# Patient Record
Sex: Female | Born: 1995 | Race: Black or African American | Hispanic: No | Marital: Single | Smoking: Current every day smoker
Health system: Southern US, Community
[De-identification: ages and names within clinical notes are randomized; demographics above are authoritative.]

## PROBLEM LIST (undated history)

## (undated) ENCOUNTER — Inpatient Hospital Stay (HOSPITAL_COMMUNITY): Payer: Self-pay

## (undated) DIAGNOSIS — Z789 Other specified health status: Secondary | ICD-10-CM

## (undated) HISTORY — PX: NO PAST SURGERIES: SHX2092

---
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
## 2013-02-19 ENCOUNTER — Inpatient Hospital Stay (HOSPITAL_COMMUNITY)
Admission: AD | Admit: 2013-02-19 | Discharge: 2013-02-19 | Disposition: A | Discharge status: Home / Self Care | Payer: Medicaid Other | Source: Ambulatory Visit | Attending: Obstetrics and Gynecology | Admitting: Obstetrics and Gynecology

## 2013-02-19 ENCOUNTER — Encounter (HOSPITAL_COMMUNITY): Payer: Self-pay | Admitting: *Deleted

## 2013-02-19 LAB — OB RESULTS CONSOLE GBS: GBS: NEGATIVE

## 2013-02-19 LAB — OB RESULTS CONSOLE HEPATITIS B SURFACE ANTIGEN: Hepatitis B Surface Ag: NEGATIVE

## 2013-02-19 LAB — OB RESULTS CONSOLE ABO/RH: RH Type: POSITIVE

## 2013-02-19 LAB — OB RESULTS CONSOLE RPR: RPR: NONREACTIVE

## 2013-02-19 NOTE — Progress Notes (Signed)
Dr Senaida Ores notified of pt's c/o constant sharp pain lower abdomen.  Notified of sve and reassuring fhr tracing.  Orders rec for discharge now or sve in 1 hour and discharge if no change.

## 2013-06-01 ENCOUNTER — Encounter (HOSPITAL_COMMUNITY): Payer: Self-pay | Admitting: *Deleted

## 2014-06-10 ENCOUNTER — Encounter (HOSPITAL_COMMUNITY): Payer: Self-pay | Admitting: *Deleted

## 2014-08-19 ENCOUNTER — Encounter (HOSPITAL_BASED_OUTPATIENT_CLINIC_OR_DEPARTMENT_OTHER): Payer: Self-pay | Admitting: *Deleted

## 2014-08-19 DIAGNOSIS — R197 Diarrhea, unspecified: Secondary | ICD-10-CM | POA: Insufficient documentation

## 2014-08-19 DIAGNOSIS — Z3202 Encounter for pregnancy test, result negative: Secondary | ICD-10-CM | POA: Insufficient documentation

## 2014-08-19 DIAGNOSIS — Z79899 Other long term (current) drug therapy: Secondary | ICD-10-CM | POA: Diagnosis not present

## 2014-08-19 DIAGNOSIS — Z720 Tobacco use: Secondary | ICD-10-CM | POA: Diagnosis not present

## 2014-08-19 DIAGNOSIS — R109 Unspecified abdominal pain: Secondary | ICD-10-CM | POA: Diagnosis present

## 2014-08-19 NOTE — ED Notes (Signed)
C/o abd pain, smelly burps, endorses bloated/distention. Onset 2d ago. Last BM 2 hrs ago, describes as watery. Diarrhea x2 today. (denies: nv fever or bleeding or back pain), no relief with gasx yesterday. Last "put food in her mouth" 30 minutes ago.

## 2014-08-20 ENCOUNTER — Emergency Department (HOSPITAL_BASED_OUTPATIENT_CLINIC_OR_DEPARTMENT_OTHER)
Admission: EM | Admit: 2014-08-20 | Discharge: 2014-08-20 | Disposition: A | Discharge status: Home / Self Care | Payer: Medicaid Other | Attending: Emergency Medicine | Admitting: Emergency Medicine

## 2014-08-20 ENCOUNTER — Emergency Department (HOSPITAL_BASED_OUTPATIENT_CLINIC_OR_DEPARTMENT_OTHER): Payer: Medicaid Other

## 2014-08-20 DIAGNOSIS — R197 Diarrhea, unspecified: Secondary | ICD-10-CM

## 2014-08-20 DIAGNOSIS — R52 Pain, unspecified: Secondary | ICD-10-CM

## 2014-08-20 LAB — CBC WITH DIFFERENTIAL/PLATELET
BASOS PCT: 0 % (ref 0–1)
Basophils Absolute: 0 10*3/uL (ref 0.0–0.1)
EOS PCT: 1 % (ref 0–5)
Eosinophils Absolute: 0.1 10*3/uL (ref 0.0–0.7)
HEMATOCRIT: 36.1 % (ref 36.0–46.0)
HEMOGLOBIN: 12 g/dL (ref 12.0–15.0)
LYMPHS PCT: 36 % (ref 12–46)
Lymphs Abs: 3.1 10*3/uL (ref 0.7–4.0)
MCH: 26.7 pg (ref 26.0–34.0)
MCHC: 33.2 g/dL (ref 30.0–36.0)
MCV: 80.4 fL (ref 78.0–100.0)
MONOS PCT: 8 % (ref 3–12)
Monocytes Absolute: 0.7 10*3/uL (ref 0.1–1.0)
NEUTROS ABS: 4.8 10*3/uL (ref 1.7–7.7)
Neutrophils Relative %: 55 % (ref 43–77)
Platelets: 221 10*3/uL (ref 150–400)
RBC: 4.49 MIL/uL (ref 3.87–5.11)
RDW: 13.8 % (ref 11.5–15.5)
WBC: 8.7 10*3/uL (ref 4.0–10.5)

## 2014-08-20 LAB — COMPREHENSIVE METABOLIC PANEL
ALBUMIN: 3.8 g/dL (ref 3.5–5.2)
ALK PHOS: 61 U/L (ref 39–117)
ALT: 13 U/L (ref 0–35)
ANION GAP: 5 (ref 5–15)
AST: 19 U/L (ref 0–37)
BUN: 11 mg/dL (ref 6–23)
CALCIUM: 8.5 mg/dL (ref 8.4–10.5)
CO2: 26 mmol/L (ref 19–32)
CREATININE: 0.68 mg/dL (ref 0.50–1.10)
Chloride: 107 mEq/L (ref 96–112)
GFR calc non Af Amer: >90 mL/min (ref >90)
GLUCOSE: 97 mg/dL (ref 70–99)
POTASSIUM: 3.3 mmol/L — AB (ref 3.5–5.1)
Sodium: 138 mmol/L (ref 135–145)
TOTAL PROTEIN: 6.7 g/dL (ref 6.0–8.3)
Total Bilirubin: 0.1 mg/dL — ABNORMAL LOW (ref 0.3–1.2)

## 2014-08-20 LAB — LIPASE, BLOOD: Lipase: 29 U/L (ref 11–59)

## 2014-08-20 LAB — PREGNANCY, URINE: Preg Test, Ur: NEGATIVE

## 2014-08-20 MED ORDER — PANTOPRAZOLE SODIUM 40 MG IV SOLR
40.0000 mg | Freq: Once | INTRAVENOUS | Status: AC
  Administered 2014-08-20: 40 mg via INTRAVENOUS
  Filled 2014-08-20: qty 40

## 2014-08-20 MED ORDER — FENTANYL CITRATE 0.05 MG/ML IJ SOLN
INTRAMUSCULAR | Status: AC
  Administered 2014-08-20: 50 ug via INTRAVENOUS
  Filled 2014-08-20: qty 2

## 2014-08-20 MED ORDER — FENTANYL CITRATE 0.05 MG/ML IJ SOLN
50.0000 ug | Freq: Once | INTRAMUSCULAR | Status: AC
  Administered 2014-08-20: 50 ug via INTRAVENOUS

## 2014-08-20 NOTE — ED Notes (Signed)
Up to b/r, steady gait 

## 2014-08-20 NOTE — ED Notes (Signed)
Dr. Molpus into room 

## 2014-08-20 NOTE — ED Provider Notes (Signed)
CSN: [id number]     Arrival date & time 08/19/14  2258 History   First MD Initiated Contact with Patient 08/20/14 0205     Chief Complaint  Patient presents with  . Abdominal Pain     (Consider location/radiation/quality/duration/timing/severity/associated sxs/prior Treatment) HPI  This is a 19 year old female with a two-day history of abdominal pain. She states the abdominal pain is diffuse, is described as a pressure, and she rates it an 8 out of 10. It is not worse with eating. It is somewhat worse with movement or palpation. It is relieved with belching. She states her burps smell bad. She has taken Gas-X without relief. She also feels like her abdomen is bloated or distended. She denies fever, chills, nausea, vomiting, constipation, vaginal bleeding or dysuria. She has had watery, non-bloody diarrhea.  Past Medical History  Diagnosis Date  . No pertinent past medical history    Past Surgical History  Procedure Laterality Date  . No past surgeries     Family History  Problem Relation Age of Onset  . Adopted: Yes   History  Substance Use Topics  . Smoking status: Current Every Day Smoker    Types: Cigarettes  . Smokeless tobacco: Never Used     Comment: with preg  . Alcohol Use: No   OB History    Gravida Para Term Preterm AB TAB SAB Ectopic Multiple Living   1              Review of Systems  All other systems reviewed and are negative.   Allergies  Review of patient's allergies indicates no known allergies.  Home Medications   Prior to Admission medications   Medication Sig Start Date End Date Taking? Authorizing Provider  Prenatal Vit-Fe Fumarate-FA (PRENATAL MULTIVITAMIN) TABS Take 1 tablet by mouth daily.    Historical Provider, MD   BP 114/73 mmHg  Pulse 72  Temp(Src) 98.3 F (36.8 C) (Oral)  Resp 18  Ht 5\' 4"  (1.626 m)  Wt 145 lb (65.772 kg)  BMI 24.88 kg/m2  SpO2 100%  LMP 08/11/2014   Physical Exam General: Well-developed, well-nourished  female in no acute distress; appearance consistent with age of record HENT: normocephalic; atraumatic Eyes: pupils equal, round and reactive to light; extraocular muscles intact Neck: supple Heart: regular rate and rhythm Lungs: clear to auscultation bilaterally Abdomen: soft; nondistended; right upper quadrant and epigastric tenderness; no masses or hepatosplenomegaly; bowel sounds present; gallbladder not visualized on bedside ultrasound Extremities: No deformity; full range of motion; pulses normal Neurologic: Awake, alert and oriented; motor function intact in all extremities and symmetric; no facial droop Skin: Warm and dry Psychiatric: Flat affect    ED Course  Procedures (including critical care time)   MDM  Nursing notes and vitals signs, including pulse oximetry, reviewed.  Summary of this visit's results, reviewed by myself:  Labs:  Results for orders placed or performed during the hospital encounter of 08/20/14 (from the past 24 hour(s))  Comprehensive metabolic panel     Status: Abnormal   Collection Time: 08/20/14  2:21 AM  Result Value Ref Range   Sodium 138 135 - 145 mmol/L   Potassium 3.3 (L) 3.5 - 5.1 mmol/L   Chloride 107 96 - 112 mEq/L   CO2 26 19 - 32 mmol/L   Glucose, Bld 97 70 - 99 mg/dL   BUN 11 6 - 23 mg/dL   Creatinine, Ser 4.090.68 0.50 - 1.10 mg/dL   Calcium 8.5 8.4 - 81.110.5 mg/dL  Total Protein 6.7 6.0 - 8.3 g/dL   Albumin 3.8 3.5 - 5.2 g/dL   AST 19 0 - 37 U/L   ALT 13 0 - 35 U/L   Alkaline Phosphatase 61 39 - 117 U/L   Total Bilirubin 0.1 (L) 0.3 - 1.2 mg/dL   GFR calc non Af Amer >90 >90 mL/min   GFR calc Af Amer >90 >90 mL/min   Anion gap 5 5 - 15  CBC with Differential     Status: None   Collection Time: 08/20/14  2:21 AM  Result Value Ref Range   WBC 8.7 4.0 - 10.5 K/uL   RBC 4.49 3.87 - 5.11 MIL/uL   Hemoglobin 12.0 12.0 - 15.0 g/dL   HCT 16.1 09.6 - 04.5 %   MCV 80.4 78.0 - 100.0 fL   MCH 26.7 26.0 - 34.0 pg   MCHC 33.2 30.0 - 36.0  g/dL   RDW 40.9 81.1 - 91.4 %   Platelets 221 150 - 400 K/uL   Neutrophils Relative % 55 43 - 77 %   Lymphocytes Relative 36 12 - 46 %   Monocytes Relative 8 3 - 12 %   Eosinophils Relative 1 0 - 5 %   Basophils Relative 0 0 - 1 %   Neutro Abs 4.8 1.7 - 7.7 K/uL   Lymphs Abs 3.1 0.7 - 4.0 K/uL   Monocytes Absolute 0.7 0.1 - 1.0 K/uL   Eosinophils Absolute 0.1 0.0 - 0.7 K/uL   Basophils Absolute 0.0 0.0 - 0.1 K/uL   Smear Review LARGE PLATELETS PRESENT   Lipase, blood     Status: None   Collection Time: 08/20/14  2:21 AM  Result Value Ref Range   Lipase 29 11 - 59 U/L  Pregnancy, urine     Status: None   Collection Time: 08/20/14  3:00 AM  Result Value Ref Range   Preg Test, Ur NEGATIVE NEGATIVE    Imaging Studies: Dg Abd 1 View  08/20/2014   CLINICAL DATA:  Acute onset of generalized abdominal pain, with bloating and distention. Initial encounter.  EXAM: ABDOMEN - 1 VIEW  COMPARISON:  None.  FINDINGS: There is a somewhat unusual appearance to the descending colon, with question of mild mucosal edema along the proximal sigmoid colon. This could reflect a mild infectious or inflammatory process. The remaining bowel gas pattern is grossly unremarkable. No stool is seen within the colon. The visualized small bowel is grossly unremarkable. No free intra-abdominal air is identified, though evaluation for free air is limited on a single supine view.  The visualized osseous structures are within normal limits; the sacroiliac joints are unremarkable in appearance.  IMPRESSION: Somewhat unusual appearance to the descending colon, with question of mild mucosal edema along the proximal sigmoid colon. This could reflect a mild infectious or inflammatory process. Bowel gas pattern otherwise unremarkable. No free intra-abdominal air seen.   Electronically Signed   By: Roanna Raider M.D.   On: 08/20/2014 03:34   Patient advised of findings. Advised to return for worsening pain, bloody diarrhea or  worsening distension. She was advised to continue the simethicone.      Hanley Seamen, MD 08/20/14 [phone]

## 2014-08-20 NOTE — ED Notes (Signed)
Back from xray, alert, NAD, calm, interactive.  

## 2015-04-10 ENCOUNTER — Encounter (HOSPITAL_BASED_OUTPATIENT_CLINIC_OR_DEPARTMENT_OTHER): Payer: Self-pay

## 2015-04-10 ENCOUNTER — Emergency Department (HOSPITAL_BASED_OUTPATIENT_CLINIC_OR_DEPARTMENT_OTHER)
Admission: EM | Admit: 2015-04-10 | Discharge: 2015-04-10 | Disposition: A | Discharge status: Home / Self Care | Payer: Medicaid Other | Attending: Emergency Medicine | Admitting: Emergency Medicine

## 2015-04-10 DIAGNOSIS — Z3202 Encounter for pregnancy test, result negative: Secondary | ICD-10-CM | POA: Insufficient documentation

## 2015-04-10 DIAGNOSIS — A5901 Trichomonal vulvovaginitis: Secondary | ICD-10-CM | POA: Insufficient documentation

## 2015-04-10 DIAGNOSIS — N898 Other specified noninflammatory disorders of vagina: Secondary | ICD-10-CM | POA: Diagnosis present

## 2015-04-10 DIAGNOSIS — N760 Acute vaginitis: Secondary | ICD-10-CM | POA: Diagnosis not present

## 2015-04-10 DIAGNOSIS — B9689 Other specified bacterial agents as the cause of diseases classified elsewhere: Secondary | ICD-10-CM

## 2015-04-10 DIAGNOSIS — Z720 Tobacco use: Secondary | ICD-10-CM | POA: Insufficient documentation

## 2015-04-10 LAB — WET PREP, GENITAL: Yeast Wet Prep HPF POC: NONE SEEN

## 2015-04-10 LAB — URINALYSIS, ROUTINE W REFLEX MICROSCOPIC
BILIRUBIN URINE: NEGATIVE
GLUCOSE, UA: NEGATIVE mg/dL
HGB URINE DIPSTICK: NEGATIVE
KETONES UR: 15 mg/dL — AB
NITRITE: NEGATIVE
PH: 7 (ref 5.0–8.0)
Protein, ur: NEGATIVE mg/dL
SPECIFIC GRAVITY, URINE: 1.029 (ref 1.005–1.030)
Urobilinogen, UA: 1 mg/dL (ref 0.0–1.0)

## 2015-04-10 LAB — PREGNANCY, URINE: PREG TEST UR: NEGATIVE

## 2015-04-10 LAB — URINE MICROSCOPIC-ADD ON

## 2015-04-10 MED ORDER — METRONIDAZOLE 500 MG PO TABS: 500.0000 mg | ORAL_TABLET | Freq: Two times a day (BID) | ORAL | Status: DC

## 2015-04-10 NOTE — Discharge Instructions (Signed)
Bacterial Vaginosis °Bacterial vaginosis is a vaginal infection that occurs when the normal balance of bacteria in the vagina is disrupted. It results from an overgrowth of certain bacteria. This is the most common vaginal infection in women of childbearing age. Treatment is important to prevent complications, especially in pregnant women, as it can cause a premature delivery. °CAUSES  °Bacterial vaginosis is caused by an increase in harmful bacteria that are normally present in smaller amounts in the vagina. Several different kinds of bacteria can cause bacterial vaginosis. However, the reason that the condition develops is not fully understood. °RISK FACTORS °Certain activities or behaviors can put you at an increased risk of developing bacterial vaginosis, including: °· Having a new sex partner or multiple sex partners. °· Douching. °· Using an intrauterine device (IUD) for contraception. °Women do not get bacterial vaginosis from toilet seats, bedding, swimming pools, or contact with objects around them. °SIGNS AND SYMPTOMS  °Some women with bacterial vaginosis have no signs or symptoms. Common symptoms include: °· Grey vaginal discharge. °· A fishlike odor with discharge, especially after sexual intercourse. °· Itching or burning of the vagina and vulva. °· Burning or pain with urination. °DIAGNOSIS  °Your health care provider will take a medical history and examine the vagina for signs of bacterial vaginosis. A sample of vaginal fluid may be taken. Your health care provider will look at this sample under a microscope to check for bacteria and abnormal cells. A vaginal pH test may also be done.  °TREATMENT  °Bacterial vaginosis may be treated with antibiotic medicines. These may be given in the form of a pill or a vaginal cream. A second round of antibiotics may be prescribed if the condition comes back after treatment.  °HOME CARE INSTRUCTIONS  °· Only take over-the-counter or prescription medicines as  directed by your health care provider. °· If antibiotic medicine was prescribed, take it as directed. Make sure you finish it even if you start to feel better. °· Do not have sex until treatment is completed. °· Tell all sexual partners that you have a vaginal infection. They should see their health care provider and be treated if they have problems, such as a mild rash or itching. °· Practice safe sex by using condoms and only having one sex partner. °SEEK MEDICAL CARE IF:  °· Your symptoms are not improving after 3 days of treatment. °· You have increased discharge or pain. °· You have a fever. °MAKE SURE YOU:  °· Understand these instructions. °· Will watch your condition. °· Will get help right away if you are not doing well or get worse. °FOR MORE INFORMATION  °Centers for Disease Control and Prevention, Division of STD Prevention: www.cdc.gov/std °American Sexual Health Association (ASHA): www.ashastd.org  °Document Released: 07/26/2005 Document Revised: 05/16/2013 Document Reviewed: 03/07/2013 °ExitCare® Patient Information ©2015 [employer]. This information is not intended to replace advice given to you by your health care provider. Make sure you discuss any questions you have with your health care provider. °Trichomoniasis °Trichomoniasis is an infection caused by an organism called Trichomonas. The infection can affect both women and men. In women, the outer female genitalia and the vagina are affected. In men, the penis is mainly affected, but the prostate and other reproductive organs can also be involved. Trichomoniasis is a sexually transmitted infection (STI) and is most often passed to another person through sexual contact.  °RISK FACTORS °· Having unprotected sexual intercourse. °· Having sexual intercourse with an infected partner. °SIGNS AND SYMPTOMS  °  Symptoms of trichomoniasis in women include: °· Abnormal gray-green frothy vaginal discharge. °· Itching and irritation of the  vagina. °· Itching and irritation of the area outside the vagina. °Symptoms of trichomoniasis in men include:  °· Penile discharge with or without pain. °· Pain during urination. This results from inflammation of the urethra. °DIAGNOSIS  °Trichomoniasis may be found during a Pap test or physical exam. Your health care provider may use one of the following methods to help diagnose this infection: °· Examining vaginal discharge under a microscope. For men, urethral discharge would be examined. °· Testing the pH of the vagina with a test tape. °· Using a vaginal swab test that checks for the Trichomonas organism. A test is available that provides results within a few minutes. °· Doing a culture test for the organism. This is not usually needed. °TREATMENT  °· You may be given medicine to fight the infection. Women should inform their health care provider if they could be or are pregnant. Some medicines used to treat the infection should not be taken during pregnancy. °· Your health care provider may recommend over-the-counter medicines or creams to decrease itching or irritation. °· Your sexual partner will need to be treated if infected. °HOME CARE INSTRUCTIONS  °· Take medicines only as directed by your health care provider. °· Take over-the-counter medicine for itching or irritation as directed by your health care provider. °· Do not have sexual intercourse while you have the infection. °· Women should not douche or wear tampons while they have the infection. °· Discuss your infection with your partner. Your partner may have gotten the infection from you, or you may have gotten it from your partner. °· Have your sex partner get examined and treated if necessary. °· Practice safe, informed, and protected sex. °· See your health care provider for other STI testing. °SEEK MEDICAL CARE IF:  °· You still have symptoms after you finish your medicine. °· You develop abdominal pain. °· You have pain when you urinate. °· You  have bleeding after sexual intercourse. °· You develop a rash. °· Your medicine makes you sick or makes you throw up (vomit). °MAKE SURE YOU: °· Understand these instructions. °· Will watch your condition. °· Will get help right away if you are not doing well or get worse. °Document Released: 01/19/2001 Document Revised: 12/10/2013 Document Reviewed: 05/07/2013 °ExitCare® Patient Information ©2015 [employer]. This information is not intended to replace advice given to you by your health care provider. Make sure you discuss any questions you have with your health care provider. ° °

## 2015-04-10 NOTE — ED Notes (Signed)
Intermittent vaginal d/c x 1 month

## 2015-04-10 NOTE — ED Provider Notes (Signed)
CSN: [id number]     Arrival date & time 04/10/15  1238 History   First MD Initiated Contact with Patient 04/10/15 1257     Chief Complaint  Patient presents with  . Vaginal Discharge     (Consider location/radiation/quality/duration/timing/severity/associated sxs/prior Treatment) Patient is a 19 y.o. female presenting with vaginal discharge. The history is provided by the patient. No language interpreter was used.  Vaginal Discharge Quality:  Unable to specify Severity:  Moderate Onset quality:  Gradual Duration:  1 month Timing:  Constant Progression:  Worsening Chronicity:  New Context: not at rest   Relieved by:  Nothing Worsened by:  Nothing tried Ineffective treatments:  None tried Associated symptoms: vaginal itching   Risk factors: no STI     Past Medical History  Diagnosis Date  . No pertinent past medical history    Past Surgical History  Procedure Laterality Date  . No past surgeries     Family History  Problem Relation Age of Onset  . Adopted: Yes   Social History  Substance Use Topics  . Smoking status: Current Every Day Smoker    Types: Cigarettes  . Smokeless tobacco: Never Used  . Alcohol Use: No   OB History    Gravida Para Term Preterm AB TAB SAB Ectopic Multiple Living   1              Review of Systems  Genitourinary: Positive for vaginal discharge.      Allergies  Review of patient's allergies indicates no known allergies.  Home Medications   Prior to Admission medications   Not on File   BP 114/66 mmHg  Pulse 74  Temp(Src) 98.4 F (36.9 C) (Oral)  Resp 16  Ht  (1.626 m)  Wt 137 lb (62.143 kg)  BMI 23.50 kg/m2  SpO2 100%  LMP 03/31/2015 Physical Exam  Constitutional: She is oriented to person, place, and time.  HENT:  Head: Normocephalic.  Eyes: Pupils are equal, round, and reactive to light.  Neck: Normal range of motion.  Cardiovascular: Normal rate and normal heart sounds.   Pulmonary/Chest: Effort normal.   Abdominal: Soft.  Genitourinary: Vaginal discharge found.  Musculoskeletal: Normal range of motion.  Neurological: She is alert and oriented to person, place, and time.  Skin: Skin is warm.  Psychiatric: She has a normal mood and affect.    ED Course  Procedures (including critical care time) Labs Review Labs Reviewed  WET PREP, GENITAL - Abnormal; Notable for the following:    Trich, Wet Prep MODERATE (*)    Clue Cells Wet Prep HPF POC MODERATE (*)    WBC, Wet Prep HPF POC TOO NUMEROUS TO COUNT (*)    All other components within normal limits  URINALYSIS, ROUTINE W REFLEX MICROSCOPIC (NOT AT [facility]) - Abnormal; Notable for the following:    Ketones, ur 15 (*)    Leukocytes, UA SMALL (*)    All other components within normal limits  URINE MICROSCOPIC-ADD ON - Abnormal; Notable for the following:    Bacteria, UA FEW (*)    All other components within normal limits  PREGNANCY, URINE  GC/CHLAMYDIA PROBE AMP ([place]) NOT AT [facility]    Imaging Review No results found. I have personally reviewed and evaluated these images and lab results as part of my medical decision-making.   EKG Interpretation None      MDM   Final diagnoses:  Trichomonas vaginitis  BV (bacterial vaginosis)    Flagyl Return if any  problems avs    Elson Areas, PA-C 04/10/15 1507  Geoffery Lyons, MD 04/14/15 [phone]

## 2015-04-11 LAB — GC/CHLAMYDIA PROBE AMP (~~LOC~~) NOT AT ARMC
CHLAMYDIA, DNA PROBE: NEGATIVE
Neisseria Gonorrhea: NEGATIVE

## 2015-09-15 ENCOUNTER — Emergency Department (HOSPITAL_COMMUNITY): Payer: Medicaid Other

## 2015-09-15 ENCOUNTER — Encounter (HOSPITAL_COMMUNITY): Payer: Self-pay | Admitting: Emergency Medicine

## 2015-09-15 ENCOUNTER — Emergency Department (HOSPITAL_COMMUNITY)
Admission: EM | Admit: 2015-09-15 | Discharge: 2015-09-15 | Disposition: A | Discharge status: Home / Self Care | Payer: Medicaid Other | Attending: Emergency Medicine | Admitting: Emergency Medicine

## 2015-09-15 DIAGNOSIS — Z792 Long term (current) use of antibiotics: Secondary | ICD-10-CM | POA: Diagnosis not present

## 2015-09-15 DIAGNOSIS — M7981 Nontraumatic hematoma of soft tissue: Secondary | ICD-10-CM | POA: Diagnosis not present

## 2015-09-15 DIAGNOSIS — F17210 Nicotine dependence, cigarettes, uncomplicated: Secondary | ICD-10-CM | POA: Diagnosis not present

## 2015-09-15 DIAGNOSIS — T148XXA Other injury of unspecified body region, initial encounter: Secondary | ICD-10-CM

## 2015-09-15 DIAGNOSIS — M79672 Pain in left foot: Secondary | ICD-10-CM

## 2015-09-15 DIAGNOSIS — M79661 Pain in right lower leg: Secondary | ICD-10-CM | POA: Diagnosis present

## 2015-09-15 MED ORDER — IBUPROFEN 200 MG PO TABS
600.0000 mg | ORAL_TABLET | Freq: Once | ORAL | Status: AC
  Administered 2015-09-15: 600 mg via ORAL
  Filled 2015-09-15: qty 3

## 2015-09-15 MED ORDER — IBUPROFEN 600 MG PO TABS: 600.0000 mg | ORAL_TABLET | Freq: Three times a day (TID) | ORAL | Status: AC

## 2015-09-15 NOTE — ED Notes (Signed)
Pt states that she started having atraumatic pain in her L foot tonight and noticed a swelling below her R knee. Alert and oriented.

## 2015-09-15 NOTE — Discharge Instructions (Signed)
Cryotherapy Cryotherapy is when you put ice on your injury. Ice helps lessen pain and puffiness (swelling) after an injury. Ice works the best when you start using it in the first 24 to 48 hours after an injury. HOME CARE  Put a dry or damp towel between the ice pack and your skin.  You may press gently on the ice pack.  Leave the ice on for no more than 10 to 20 minutes at a time.  Check your skin after 5 minutes to make sure your skin is okay.  Rest at least 20 minutes between ice pack uses.  Stop using ice when your skin loses feeling (numbness).  Do not use ice on someone who cannot tell you when it hurts. This includes small children and people with memory problems (dementia). GET HELP RIGHT AWAY IF:  You have white spots on your skin.  Your skin turns blue or pale.  Your skin feels waxy or hard.  Your puffiness gets worse. MAKE SURE YOU:   Understand these instructions.  Will watch your condition.  Will get help right away if you are not doing well or get worse.   This information is not intended to replace advice given to you by your health care provider. Make sure you discuss any questions you have with your health care provider.   Document Released: 01/12/2008 Document Revised: 10/18/2011 Document Reviewed: 03/18/2011 Elsevier Interactive Patient Education 2016 [employer]. Your foot xray is normal you have been placed in an ACE wrap for comfort I would suggest wearing different shoes for a few days
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
## 2015-09-15 NOTE — ED Notes (Signed)
Called pt back to fast track x1 with no response

## 2015-10-29 ENCOUNTER — Encounter (HOSPITAL_COMMUNITY): Payer: Self-pay | Admitting: *Deleted

## 2015-10-29 ENCOUNTER — Emergency Department (HOSPITAL_COMMUNITY)
Admission: EM | Admit: 2015-10-29 | Discharge: 2015-10-29 | Disposition: A | Discharge status: Home / Self Care | Payer: Medicaid Other | Attending: Emergency Medicine | Admitting: Emergency Medicine

## 2015-10-29 DIAGNOSIS — F17210 Nicotine dependence, cigarettes, uncomplicated: Secondary | ICD-10-CM | POA: Diagnosis not present

## 2015-10-29 DIAGNOSIS — N939 Abnormal uterine and vaginal bleeding, unspecified: Secondary | ICD-10-CM | POA: Diagnosis present

## 2015-10-29 LAB — COMPREHENSIVE METABOLIC PANEL
ALK PHOS: 56 U/L (ref 38–126)
ALT: 10 U/L — AB (ref 14–54)
AST: 18 U/L (ref 15–41)
Albumin: 3.7 g/dL (ref 3.5–5.0)
Anion gap: 10 (ref 5–15)
BILIRUBIN TOTAL: 0.5 mg/dL (ref 0.3–1.2)
BUN: 17 mg/dL (ref 6–20)
CALCIUM: 9.5 mg/dL (ref 8.9–10.3)
CO2: 23 mmol/L (ref 22–32)
CREATININE: 0.85 mg/dL (ref 0.44–1.00)
Chloride: 109 mmol/L (ref 101–111)
GFR calc non Af Amer: >60 mL/min (ref >60)
Glucose, Bld: 94 mg/dL (ref 65–99)
Potassium: 3.9 mmol/L (ref 3.5–5.1)
SODIUM: 142 mmol/L (ref 135–145)
Total Protein: 7.6 g/dL (ref 6.5–8.1)

## 2015-10-29 LAB — CBC
HCT: 35 % — ABNORMAL LOW (ref 36.0–46.0)
Hemoglobin: 11.9 g/dL — ABNORMAL LOW (ref 12.0–15.0)
MCH: 27.8 pg (ref 26.0–34.0)
MCHC: 34 g/dL (ref 30.0–36.0)
MCV: 81.8 fL (ref 78.0–100.0)
Platelets: 262 10*3/uL (ref 150–400)
RBC: 4.28 MIL/uL (ref 3.87–5.11)
RDW: 14.9 % (ref 11.5–15.5)
WBC: 9.4 10*3/uL (ref 4.0–10.5)

## 2015-10-29 LAB — I-STAT BETA HCG BLOOD, ED (MC, WL, AP ONLY)

## 2015-10-29 NOTE — ED Notes (Signed)
Pt reports lmp 2/28 and it was normal cycle. Now had heavy abnormal vaginal bleeding 3/15. Reports its heavy with large blood clots. Now having right side lower abd pains x 2 days.

## 2015-10-29 NOTE — ED Notes (Signed)
Call no answer 

## 2016-01-10 ENCOUNTER — Encounter (HOSPITAL_COMMUNITY): Payer: Self-pay

## 2016-01-10 ENCOUNTER — Emergency Department (HOSPITAL_COMMUNITY)
Admission: EM | Admit: 2016-01-10 | Discharge: 2016-01-10 | Disposition: A | Discharge status: Home / Self Care | Payer: Medicaid Other | Attending: Emergency Medicine | Admitting: Emergency Medicine

## 2016-01-10 DIAGNOSIS — N939 Abnormal uterine and vaginal bleeding, unspecified: Secondary | ICD-10-CM | POA: Diagnosis not present

## 2016-01-10 DIAGNOSIS — F17210 Nicotine dependence, cigarettes, uncomplicated: Secondary | ICD-10-CM | POA: Diagnosis not present

## 2016-01-10 DIAGNOSIS — Z791 Long term (current) use of non-steroidal anti-inflammatories (NSAID): Secondary | ICD-10-CM | POA: Insufficient documentation

## 2016-01-10 LAB — I-STAT CHEM 8, ED
BUN: 16 mg/dL (ref 6–20)
CREATININE: 0.9 mg/dL (ref 0.44–1.00)
Calcium, Ion: 1.15 mmol/L (ref 1.12–1.23)
Chloride: 105 mmol/L (ref 101–111)
Glucose, Bld: 85 mg/dL (ref 65–99)
HEMATOCRIT: 42 % (ref 36.0–46.0)
HEMOGLOBIN: 14.3 g/dL (ref 12.0–15.0)
POTASSIUM: 4.2 mmol/L (ref 3.5–5.1)
SODIUM: 142 mmol/L (ref 135–145)
TCO2: 23 mmol/L (ref 0–100)

## 2016-01-10 LAB — I-STAT BETA HCG BLOOD, ED (MC, WL, AP ONLY): I-stat hCG, quantitative: <5 m[IU]/mL (ref <5)

## 2016-01-10 NOTE — ED Notes (Signed)
PT walked out with out DC instructions.

## 2016-01-10 NOTE — Discharge Instructions (Signed)
Your abnormal uterine bleeding will need to be further worked up by an [facility] designer[facility]YN. You will need to see your regular doctor listed on your medicaid card in order to have ongoing management and get referred to the OBGYN specialist. Stay well hydrated. If symptoms change or worsen, [facility] (called the [facility]) for emergent treatment.    Abnormal Uterine Bleeding Abnormal uterine bleeding means bleeding from the vagina that is not your normal menstrual period. This can be:  Bleeding or spotting between periods.  Bleeding after sex (sexual intercourse).  Bleeding that is heavier or more than normal.  Periods that last longer than usual.  Bleeding after menopause. There are many problems that may cause this. Treatment will depend on the cause of the bleeding. Any kind of bleeding that is not normal should be reviewed by your doctor.  HOME CARE Watch your condition for any changes. These actions may lessen any discomfort you are having:  Do not use tampons or douches as told by your doctor.  Change your pads often. You should get regular pelvic exams and Pap tests. Keep all appointments for tests as told by your doctor. GET HELP IF:  You are bleeding for more than 1 week.  You feel dizzy at times. GET HELP RIGHT AWAY IF:   You pass out.  You have to change pads every 15 to 30 minutes.  You have belly pain.  You have a fever.  You become sweaty or weak.  You are passing large blood clots from the vagina.  You feel sick to your stomach (nauseous) and throw up (vomit). MAKE SURE YOU:  Understand these instructions.  Will watch your condition.  Will get help right away if you are not doing well or get worse.   This information is not intended to replace advice given to you by your health care provider. Make sure you discuss any questions you have with your health care provider.   Document Released: 05/23/2009 Document Revised: 07/31/2013  Document Reviewed: 02/22/2013 Elsevier Interactive Patient Education [employer] Inc.

## 2016-01-10 NOTE — ED Notes (Signed)
Patient here with irregular vaginal bleeding intermittently x 6 weeks. Seems more concerned that only half the tampon has blood on it. No pain

## 2016-01-10 NOTE — ED Provider Notes (Signed)
CSN: [id number]     Arrival date & time 01/10/16  1042 History   First MD Initiated Contact with Patient 01/10/16 1049     Chief Complaint  Patient presents with  . irregular vaginal bleeding      (Consider location/radiation/quality/duration/timing/severity/associated sxs/prior Treatment) HPI Comments: Brittney Kirk is a 20 y.o. Female who presents to the ED with complaints of abnormal vaginal bleeding 2 months. Patient states she has been very stressed out recently and has not had time to see a doctor, but over the last 2 months she has had intermittent vaginal bleeding almost daily, stating that seems to be sporadic during the day and occasionally doesn't occur every single day and sometimes will occur multiple times per day. She states that she is using approximately 1-2 tampons per day depending on whether she has more or less bleeding in a particular day. She reports that the vaginal bleeding is like spotting, very scant, with no passage of clots, and is less than a typical menses. No known aggravating or alleviating factors. She does report that she has menstrual cycles between this abnormal bleeding, stating that she can distinguish the menses apart from the other bleeding because it is more consistent throughout the day, heavier flow, and lasts approximately 4 days which is how long her menstrual cycles typically last. Her LMP was 2 wks ago and lasted 4 days. Prior to 2 months ago she had very regular periods, spaced out every month and always lasting approximately 4 days. She denies any recent change in medications, and is sexually active with 2 partners one female and one female, occasionally unprotected.   She denies any fevers, chills, chest pain, shortness breath, lightheadedness, abdominal pain, nausea vomiting, diarrhea, constipation, dysuria, hematuria, vaginal discharge, genital lesions, arthralgias, myalgias, rashes, numbness, tingling, or focal weakness. She has a PCP listed on her  medicaid card, but hasn't gone to them for this issue.   Patient is a 20 y.o. female presenting with vaginal bleeding. The history is provided by the patient. No language interpreter was used.  Vaginal Bleeding Quality:  Spotting and lighter than menses Severity:  Mild Onset quality:  Gradual Duration:  2 months Timing:  Intermittent Progression:  Waxing and waning Chronicity:  New Menstrual history:  Regular (prior to 2 months ago, had regular periods) Number of tampons used:  ~1-2/day Possible pregnancy: no   Context: spontaneously   Relieved by:  None tried Worsened by:  Nothing tried Ineffective treatments:  None tried Associated symptoms: no abdominal pain, no dysuria, no fever, no nausea and no vaginal discharge     Past Medical History  Diagnosis Date  . No pertinent past medical history    Past Surgical History  Procedure Laterality Date  . No past surgeries     Family History  Problem Relation Age of Onset  . Adopted: Yes   Social History  Substance Use Topics  . Smoking status: Current Every Day Smoker    Types: Cigarettes  . Smokeless tobacco: Never Used  . Alcohol Use: No   OB History    Gravida Para Term Preterm AB TAB SAB Ectopic Multiple Living   1              Review of Systems  Constitutional: Negative for fever and chills.  Respiratory: Negative for shortness of breath.   Cardiovascular: Negative for chest pain.  Gastrointestinal: Negative for nausea, vomiting, abdominal pain, diarrhea and constipation.  Genitourinary: Positive for vaginal bleeding and menstrual problem. Negative for  dysuria, hematuria, vaginal discharge and genital sores.  Musculoskeletal: Negative for myalgias and arthralgias.  Skin: Negative for color change.  Allergic/Immunologic: Negative for immunocompromised state.  Neurological: Negative for weakness, light-headedness and numbness.  Psychiatric/Behavioral: Negative for confusion.   10 Systems reviewed and are negative  for acute change except as noted in the HPI.    Allergies  Review of patient's allergies indicates no known allergies.  Home Medications   Prior to Admission medications   Medication Sig Start Date End Date Taking? Authorizing Provider  ibuprofen (ADVIL,MOTRIN) 600 MG tablet Take 1 tablet (600 mg total) by mouth 3 (three) times daily. 09/15/15  Yes Earley Favor, NP   BP 130/85 mmHg  Pulse 88  Temp(Src) 98.8 F (37.1 C)  Resp 16  SpO2 100%  LMP 01/09/2016 Physical Exam  Constitutional: She is oriented to person, place, and time. Vital signs are normal. She appears well-developed and well-nourished.  Non-toxic appearance. No distress.  Afebrile, nontoxic, NAD  HENT:  Head: Normocephalic and atraumatic.  Mouth/Throat: Oropharynx is clear and moist and mucous membranes are normal.  Eyes: Conjunctivae and EOM are normal. Right eye exhibits no discharge. Left eye exhibits no discharge.  Neck: Normal range of motion. Neck supple.  Cardiovascular: Normal rate, regular rhythm, normal heart sounds and intact distal pulses.  Exam reveals no gallop and no friction rub.   No murmur heard. Pulmonary/Chest: Effort normal and breath sounds normal. No respiratory distress. She has no decreased breath sounds. She has no wheezes. She has no rhonchi. She has no rales.  Abdominal: Soft. Normal appearance and bowel sounds are normal. She exhibits no distension. There is no tenderness. There is no rigidity, no rebound, no guarding, no CVA tenderness, no tenderness at McBurney's point and negative Murphy's sign.  Soft, NTND, +BS throughout, no r/g/r, neg murphy's, neg mcburney's, no CVA TTP   Genitourinary:  Deferred by pt  Musculoskeletal: Normal range of motion.  Neurological: She is alert and oriented to person, place, and time. She has normal strength. No sensory deficit.  Skin: Skin is warm, dry and intact. No rash noted.  Psychiatric: She has a normal mood and affect.  Nursing note and vitals  reviewed.   ED Course  Procedures (including critical care time) Labs Review Labs Reviewed  I-STAT CHEM 8, ED  I-STAT BETA HCG BLOOD, ED ([facility], [facility], [facility] ONLY)    Imaging Review No results found. I have personally reviewed and evaluated these images and lab results as part of my medical decision-making.   EKG Interpretation None      MDM   Final diagnoses:  Abnormal uterine bleeding (AUB)    20 y.o. female here with abnormal uterine bleeding x2 months. States she has scant vaginal bleeding sporadically throughout the day, almost on a daily basis. No abd pain or vaginal discharge. Has menstrual cycles as well, which she can distinguish from the other bleeding because it's heavier than the daily scant bleeding. No lightheadedness, no other concerning symptoms. On exam, abd soft and nontender. Discussed that a pelvic exam would likely not reveal a cause of abnormal uterine bleeding in most cases, and that OBGYN follow up would likely be recommended and they would likely want a pelvic exam done by them during that visit as well, given this information pt declined pelvic exam. Discussed that there are many reasons for AUB, and often there are multiple solutions such as IUD/OCPs/etc, which an OBGYN would be able to manage better than from the ER. Will get  chem 8 and HCG to ensure no acute blood loss anemia, and no pregnancy. Will reassess after  12:29 PM Chem 8 WNL, Hgb 14.3/Hct 42 which is reassuring. HCG neg. Discussed f/up with OBGYN for ongoing management of her AUB. Will likely need to see PCP for referral since she has medicaid. Doubt need for medication management of vaginal bleeding on an urgent basis, given that it's fairly irregular and light, would opt for seeing OBGYN for proper management/care of her symptoms. I explained the diagnosis and have given explicit precautions to return to the ER including for any other new or worsening symptoms. The patient understands and accepts the  medical plan as it's been dictated and I have answered their questions. Discharge instructions concerning home care and prescriptions have been given. The patient is STABLE and is discharged to home in good condition.  BP 130/85 mmHg  Pulse 88  Temp(Src) 98.8 F (37.1 C)  Resp 16  SpO2 100%  LMP 01/09/2016   Priest Lockridge Camprubi-Soms, PA-C 01/10/16 1231  Melene Planan Floyd, DO 01/10/16 1259

## 2016-06-27 ENCOUNTER — Encounter (HOSPITAL_COMMUNITY): Payer: Self-pay

## 2016-06-27 ENCOUNTER — Emergency Department (HOSPITAL_COMMUNITY)
Admission: EM | Admit: 2016-06-27 | Discharge: 2016-06-27 | Disposition: A | Discharge status: Home / Self Care | Payer: Medicaid Other | Attending: Emergency Medicine | Admitting: Emergency Medicine

## 2016-06-27 DIAGNOSIS — F17210 Nicotine dependence, cigarettes, uncomplicated: Secondary | ICD-10-CM | POA: Diagnosis not present

## 2016-06-27 DIAGNOSIS — K2970 Gastritis, unspecified, without bleeding: Secondary | ICD-10-CM | POA: Diagnosis not present

## 2016-06-27 DIAGNOSIS — R110 Nausea: Secondary | ICD-10-CM

## 2016-06-27 DIAGNOSIS — R1010 Upper abdominal pain, unspecified: Secondary | ICD-10-CM

## 2016-06-27 DIAGNOSIS — K529 Noninfective gastroenteritis and colitis, unspecified: Secondary | ICD-10-CM | POA: Diagnosis not present

## 2016-06-27 DIAGNOSIS — R197 Diarrhea, unspecified: Secondary | ICD-10-CM

## 2016-06-27 DIAGNOSIS — R1084 Generalized abdominal pain: Secondary | ICD-10-CM | POA: Diagnosis present

## 2016-06-27 LAB — URINALYSIS, ROUTINE W REFLEX MICROSCOPIC
BILIRUBIN URINE: NEGATIVE
GLUCOSE, UA: NEGATIVE mg/dL
Hgb urine dipstick: NEGATIVE
KETONES UR: NEGATIVE mg/dL
Nitrite: NEGATIVE
PH: 5.5 (ref 5.0–8.0)
Protein, ur: NEGATIVE mg/dL
Specific Gravity, Urine: 1.024 (ref 1.005–1.030)

## 2016-06-27 LAB — CBC
HCT: 40 % (ref 36.0–46.0)
Hemoglobin: 13.7 g/dL (ref 12.0–15.0)
MCH: 27.1 pg (ref 26.0–34.0)
MCHC: 34.3 g/dL (ref 30.0–36.0)
MCV: 79.1 fL (ref 78.0–100.0)
Platelets: 183 10*3/uL (ref 150–400)
RBC: 5.06 MIL/uL (ref 3.87–5.11)
RDW: 14.2 % (ref 11.5–15.5)
WBC: 8.2 10*3/uL (ref 4.0–10.5)

## 2016-06-27 LAB — COMPREHENSIVE METABOLIC PANEL
ALT: 12 U/L — ABNORMAL LOW (ref 14–54)
AST: 25 U/L (ref 15–41)
Albumin: 3.9 g/dL (ref 3.5–5.0)
Alkaline Phosphatase: 53 U/L (ref 38–126)
Anion gap: 7 (ref 5–15)
BUN: 6 mg/dL (ref 6–20)
CO2: 24 mmol/L (ref 22–32)
Calcium: 9.2 mg/dL (ref 8.9–10.3)
Chloride: 106 mmol/L (ref 101–111)
Creatinine, Ser: 0.93 mg/dL (ref 0.44–1.00)
GFR calc Af Amer: >60 mL/min (ref >60)
GFR calc non Af Amer: >60 mL/min (ref >60)
Glucose, Bld: 123 mg/dL — ABNORMAL HIGH (ref 65–99)
Potassium: 3.4 mmol/L — ABNORMAL LOW (ref 3.5–5.1)
Sodium: 137 mmol/L (ref 135–145)
Total Bilirubin: 0.3 mg/dL (ref 0.3–1.2)
Total Protein: 7.5 g/dL (ref 6.5–8.1)

## 2016-06-27 LAB — URINE MICROSCOPIC-ADD ON
BACTERIA UA: NONE SEEN
RBC / HPF: NONE SEEN RBC/hpf (ref 0–5)

## 2016-06-27 LAB — LIPASE, BLOOD: Lipase: 25 U/L (ref 11–51)

## 2016-06-27 LAB — I-STAT BETA HCG BLOOD, ED (MC, WL, AP ONLY): I-stat hCG, quantitative: <5 m[IU]/mL (ref <5)

## 2016-06-27 MED ORDER — ONDANSETRON 4 MG PO TBDP
8.0000 mg | ORAL_TABLET | Freq: Once | ORAL | Status: AC
  Administered 2016-06-27: 8 mg via ORAL
  Filled 2016-06-27: qty 2

## 2016-06-27 MED ORDER — DICYCLOMINE HCL 10 MG PO CAPS
10.0000 mg | ORAL_CAPSULE | Freq: Once | ORAL | Status: AC
  Administered 2016-06-27: 10 mg via ORAL
  Filled 2016-06-27: qty 1

## 2016-06-27 MED ORDER — GI COCKTAIL ~~LOC~~
30.0000 mL | Freq: Once | ORAL | Status: AC
  Administered 2016-06-27: 30 mL via ORAL
  Filled 2016-06-27: qty 30

## 2016-06-27 MED ORDER — RANITIDINE HCL 150 MG PO TABS: 150.0000 mg | ORAL_TABLET | Freq: Two times a day (BID) | ORAL | 0 refills | Status: AC

## 2016-06-27 MED ORDER — ONDANSETRON 4 MG PO TBDP: 4.0000 mg | ORAL_TABLET | Freq: Three times a day (TID) | ORAL | 0 refills | Status: AC | PRN

## 2016-06-27 MED ORDER — DICYCLOMINE HCL 20 MG PO TABS: 20.0000 mg | ORAL_TABLET | Freq: Two times a day (BID) | ORAL | 0 refills | Status: AC | PRN

## 2016-06-27 NOTE — ED Triage Notes (Signed)
CALLED X 2-no answer in lobby

## 2016-06-27 NOTE — ED Triage Notes (Signed)
Patient complains of 2 days of generalized abdominal pain with cramping and diarrhea. No nausea, no vomiting. States that her daughter has same.

## 2016-06-27 NOTE — Discharge Instructions (Signed)
Your abdominal pain is likely from gastritis or an ulcer, or could be from something like irritable bowel syndrome. You will need to take zantac as directed, and avoid spicy/fatty/acidic foods, avoid soda/coffee/tea/alcohol. Avoid laying down flat within 30 minutes of eating. Avoid NSAIDs like ibuprofen/aleve/motrin/etc on an empty stomach. May consider using over the counter tums/maalox as needed for additional relief. Use zofran as directed as needed for nausea. Use tylenol as needed for pain. Use bentyl as directed as needed for abdominal cramping and diarrhea. Follow a BRAT (banana-rice-applesauce-toast) diet as described below for the next 24-48 hours. The 'BRAT' diet is suggested, then progress to diet as tolerated as symptoms abate. Call your regular doctor if bloody stools, persistent diarrhea, vomiting, fever or abdominal pain. Follow up with your regular doctor in one week for ongoing evaluation of your abdominal pain. Return to the ER for changes or worsening symptoms.   Abdominal (belly) pain can be caused by many things. Your caregiver performed an examination and possibly ordered blood/urine tests and imaging (CT scan, x-rays, ultrasound). Many cases can be observed and treated at home after initial evaluation in the emergency department. Even though you are being discharged home, abdominal pain can be unpredictable. Therefore, you need a repeated exam if your pain does not resolve, returns, or worsens. Most patients with abdominal pain don't have to be admitted to the hospital or have surgery, but serious problems like appendicitis and gallbladder attacks can start out as nonspecific pain. Many abdominal conditions cannot be diagnosed in one visit, so follow-up evaluations are very important. SEEK IMMEDIATE MEDICAL ATTENTION IF YOU DEVELOP ANY OF THE FOLLOWING SYMPTOMS: The pain does not go away or becomes severe.  A temperature above 101 develops.  Repeated vomiting occurs (multiple episodes).   The pain becomes localized to portions of the abdomen. The right side could possibly be appendicitis. In an adult, the left lower portion of the abdomen could be colitis or diverticulitis.  Blood is being passed in stools or vomit (bright red or black tarry stools).  Return also if you develop chest pain, difficulty breathing, dizziness or fainting, or become confused, poorly responsive, or inconsolable (young children). The constipation stays for more than 4 days.  There is belly (abdominal) or rectal pain.  You do not seem to be getting better.

## 2016-06-27 NOTE — ED Provider Notes (Signed)
[facility]-EMERGENCY DEPT Provider Note   CSN: [id number] Arrival date & time: 06/27/16  1003     History   Chief Complaint Chief Complaint  Patient presents with  . Abdominal Pain  . Diarrhea    HPI Brittney Kirk is a 20 y.o. female, who presents to the ED with complaints of generalized abdominal pain and cramping that initially began 2 weeks ago after she ate chicken wasn't thoroughly cooked through, but worsened 2 days ago. She states that after she ate the undercooked chicken, she had very mild abdominal pain, and it felt different than today's symptoms. She describes the pain is 10/10 intermittent nonradiating generalized abdominal cramping that worsens when she needs to have a bowel movement and has been unrelieved with Pepto-Bismol, Tums, and hot tea. Associated symptoms include nausea, and 10 episodes of nonbloody watery diarrhea since yesterday. Positive sick contacts at home, daughter has similar symptoms.  She denies any fevers, chills, chest pain, shortness breath, vomiting, constipation, obstipation, melena, hematochezia, dysuria, hematuria, vaginal bleeding or discharge, numbness, tingling, focal weakness, recent travel, alcohol use, NSAID use, or prior abdominal surgeries.   The history is provided by the patient and medical records. No language interpreter was used.  Abdominal Pain   This is a new problem. The current episode started more than 1 week ago. The problem occurs daily. The problem has been gradually worsening. The pain is associated with suspicious food intake and sick contacts. The pain is located in the generalized abdominal region. The quality of the pain is cramping. The pain is at a severity of 10/10. The pain is moderate. Associated symptoms include diarrhea and nausea. Pertinent negatives include fever, flatus, hematochezia, melena, vomiting, constipation, dysuria, hematuria, arthralgias and myalgias. Exacerbated by: needing to have a BM. Nothing relieves the  symptoms.  Diarrhea   Associated symptoms include abdominal pain. Pertinent negatives include no vomiting, no chills, no arthralgias and no myalgias.    Past Medical History:  Diagnosis Date  . No pertinent past medical history     There are no active problems to display for this patient.   Past Surgical History:  Procedure Laterality Date  . NO PAST SURGERIES      OB History    Gravida Para Term Preterm AB Living   1             SAB TAB Ectopic Multiple Live Births                   Home Medications    Prior to Admission medications   Medication Sig Start Date End Date Taking? Authorizing Provider  ibuprofen (ADVIL,MOTRIN) 600 MG tablet Take 1 tablet (600 mg total) by mouth 3 (three) times daily. 09/15/15   Earley Favor, NP  naproxen sodium (ANAPROX) 220 MG tablet Take 220 mg by mouth daily as needed (general pain).    Historical Provider, MD    Family History Family History  Problem Relation Age of Onset  . Adopted: Yes    Social History Social History  Substance Use Topics  . Smoking status: Current Every Day Smoker    Types: Cigarettes  . Smokeless tobacco: Never Used  . Alcohol use No     Allergies   Patient has no known allergies.   Review of Systems Review of Systems  Constitutional: Negative for chills and fever.  Respiratory: Negative for shortness of breath.   Cardiovascular: Negative for chest pain.  Gastrointestinal: Positive for abdominal pain, diarrhea and nausea. Negative for anal bleeding,  blood in stool, constipation, flatus, hematochezia, melena and vomiting.  Genitourinary: Negative for dysuria, hematuria, vaginal bleeding and vaginal discharge.  Musculoskeletal: Negative for arthralgias and myalgias.  Skin: Negative for color change.  Allergic/Immunologic: Negative for immunocompromised state.  Neurological: Negative for weakness and numbness.  Psychiatric/Behavioral: Negative for confusion.   10 Systems reviewed and are negative  for acute change except as noted in the HPI.   Physical Exam Updated Vital Signs BP 148/97   Pulse 80   Temp 97.8 F (36.6 C) (Oral)   Resp 18   Ht 5\' 4"  (1.626 m)   Wt 61.2 kg   SpO2 100%   BMI 23.17 kg/m   Physical Exam  Constitutional: She is oriented to person, place, and time. Vital signs are normal. She appears well-developed and well-nourished.  Non-toxic appearance. No distress.  Afebrile, nontoxic, NAD  HENT:  Head: Normocephalic and atraumatic.  Mouth/Throat: Oropharynx is clear and moist and mucous membranes are normal.  Eyes: Conjunctivae and EOM are normal. Right eye exhibits no discharge. Left eye exhibits no discharge.  Neck: Normal range of motion. Neck supple.  Cardiovascular: Normal rate, regular rhythm, normal heart sounds and intact distal pulses.  Exam reveals no gallop and no friction rub.   No murmur heard. Pulmonary/Chest: Effort normal and breath sounds normal. No respiratory distress. She has no decreased breath sounds. She has no wheezes. She has no rhonchi. She has no rales.  Abdominal: Soft. Normal appearance and bowel sounds are normal. She exhibits no distension. There is generalized tenderness and tenderness in the epigastric area. There is no rigidity, no rebound, no guarding, no CVA tenderness, no tenderness at McBurney's point and negative Murphy's sign.  Soft, nondistended, +BS throughout, with mild generalized abd TTP but mostly in the epigastric area, no r/g/r, neg murphy's, neg mcburney's, no CVA TTP   Musculoskeletal: Normal range of motion.  Neurological: She is alert and oriented to person, place, and time. She has normal strength. No sensory deficit.  Skin: Skin is warm, dry and intact. No rash noted.  Psychiatric: She has a normal mood and affect.  Nursing note and vitals reviewed.    ED Treatments / Results  Labs (all labs ordered are listed, but only abnormal results are displayed) Labs Reviewed  COMPREHENSIVE METABOLIC PANEL -  Abnormal; Notable for the following:       Result Value   Potassium 3.4 (*)    Glucose, Bld 123 (*)    ALT 12 (*)    All other components within normal limits  URINALYSIS, ROUTINE W REFLEX MICROSCOPIC (NOT AT [facility]) - Abnormal; Notable for the following:    APPearance HAZY (*)    Leukocytes, UA TRACE (*)    All other components within normal limits  URINE MICROSCOPIC-ADD ON - Abnormal; Notable for the following:    Squamous Epithelial / LPF 0-5 (*)    All other components within normal limits  LIPASE, BLOOD  CBC  I-STAT BETA HCG BLOOD, ED ([facility], [facility], [facility] ONLY)    EKG  EKG Interpretation None       Radiology No results found.  Procedures Procedures (including critical care time)  Medications Ordered in ED Medications  dicyclomine (BENTYL) capsule 10 mg (10 mg Oral Given 06/27/16 1204)  gi cocktail (Maalox,Lidocaine,Donnatal) (30 mLs Oral Given 06/27/16 1204)  ondansetron (ZOFRAN-ODT) disintegrating tablet 8 mg (8 mg Oral Given 06/27/16 1203)     Initial Impression / Assessment and Plan / ED Course  I have reviewed the triage  vital signs and the nursing notes.  Pertinent labs & imaging results that were available during my care of the patient were reviewed by me and considered in my medical decision making (see chart for details).  Clinical Course     20 y.o. female here with abd pain, nausea, and diarrhea x2 days, had undercooked chicken 2wks ago and had some abd pain since then, but nothing like this. On exam, somewhat generalized abd TTP but most focally in the epigastric area, neg murphys, nonperitoneal. Likely gastritis vs gastroenteritis vs ?IBS, doubt the chicken was the culprit given that her symptoms didn't start for 2wks after that intake. +Sick contacts at home. Labs unremarkable, betaHCG neg, awaiting U/A. Will give bentyl, zofran, and GI cocktail and reassess after U/A  1:31 PM U/A without evidence of UTI or other concerning findings. Pt feeling much better,  tolerating PO well, stating "that numbing medicine was great!". Symptoms likely either gastritis/gastroenteritis vs ?IBS related. Will send home with bentyl/zofran/zantac, discussed BRAT diet and diet modifications for gastritis. F/up with PCP in 1wk. I explained the diagnosis and have given explicit precautions to return to the ER including for any other new or worsening symptoms. The patient understands and accepts the medical plan as it's been dictated and I have answered their questions. Discharge instructions concerning home care and prescriptions have been given. The patient is STABLE and is discharged to home in good condition.   Final Clinical Impressions(s) / ED Diagnoses   Final diagnoses:  Upper abdominal pain  Gastritis, presence of bleeding unspecified, unspecified chronicity, unspecified gastritis type  Nausea  Gastroenteritis  Diarrhea, unspecified type    New Prescriptions New Prescriptions   DICYCLOMINE (BENTYL) 20 MG TABLET    Take 1 tablet (20 mg total) by mouth 2 (two) times daily as needed for spasms (abdominal cramping and/or diarrhea).   ONDANSETRON (ZOFRAN ODT) 4 MG DISINTEGRATING TABLET    Take 1 tablet (4 mg total) by mouth every 8 (eight) hours as needed for nausea or vomiting.   RANITIDINE (ZANTAC) 150 MG TABLET    Take 1 tablet (150 mg total) by mouth 2 (two) times daily.     [address]Edith Lord Playitaamprubi-Soms, PA-C 06/27/16 1331    Vanetta MuldersScott Zackowski, MD 07/05/16 1338

## 2017-12-18 IMAGING — CR DG FOOT COMPLETE 3+V*L*
3 series · 3 of 3 positions shown · non-contrast
Comparison: None.

CLINICAL DATA: Foot pain.  No known injury.

EXAM:
LEFT FOOT - COMPLETE 3+ VIEW

[x foot ap left]
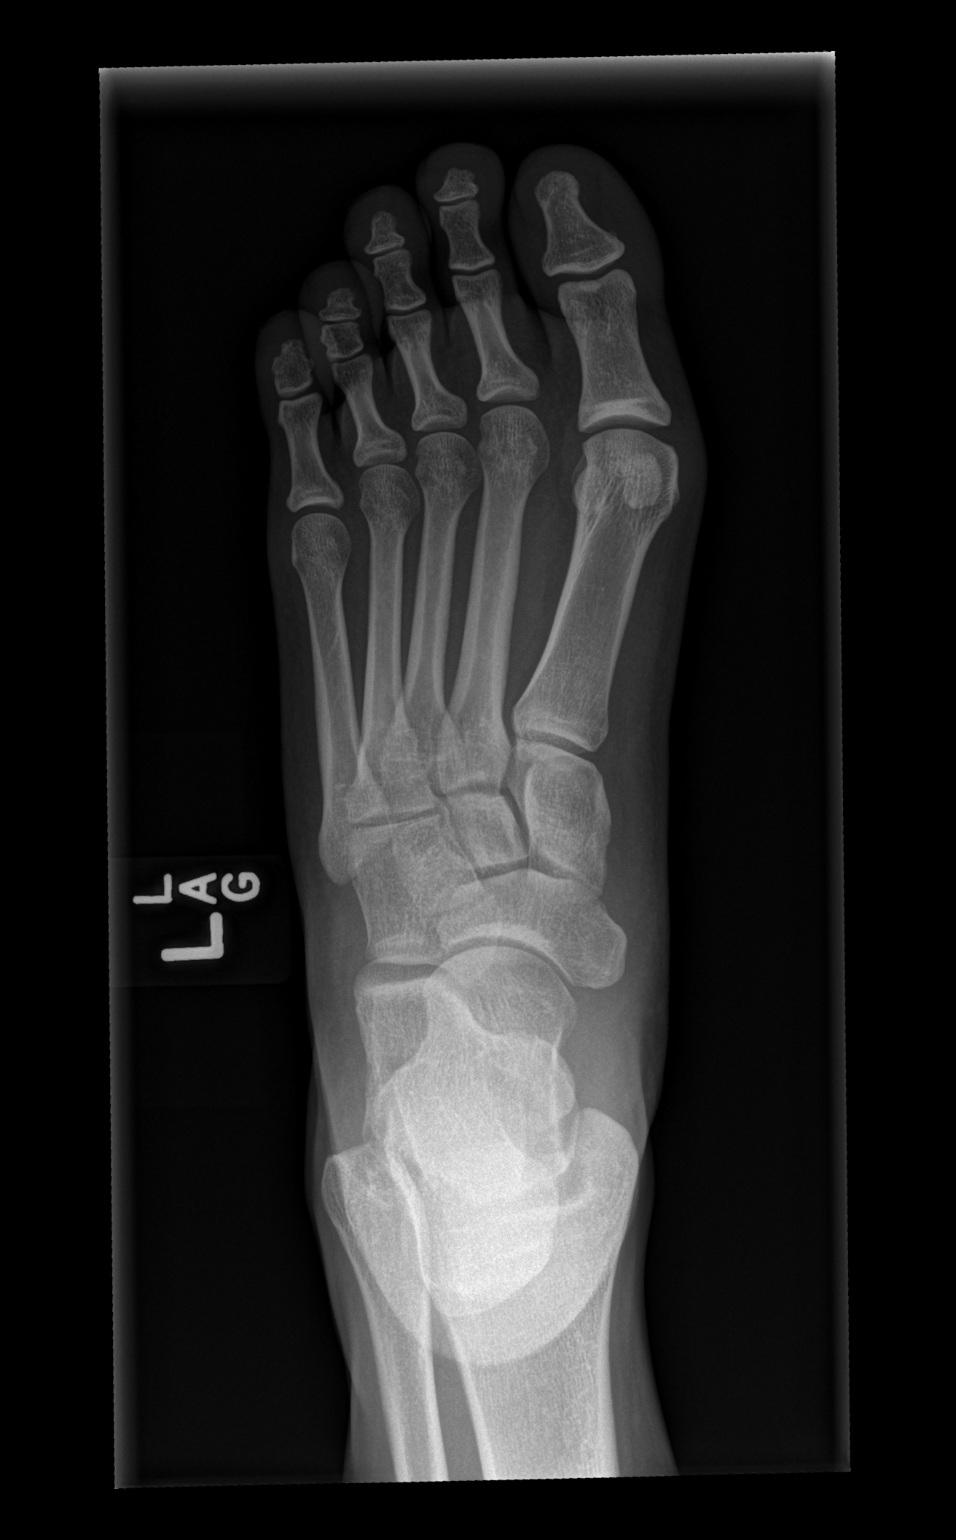

[x foot obl left]
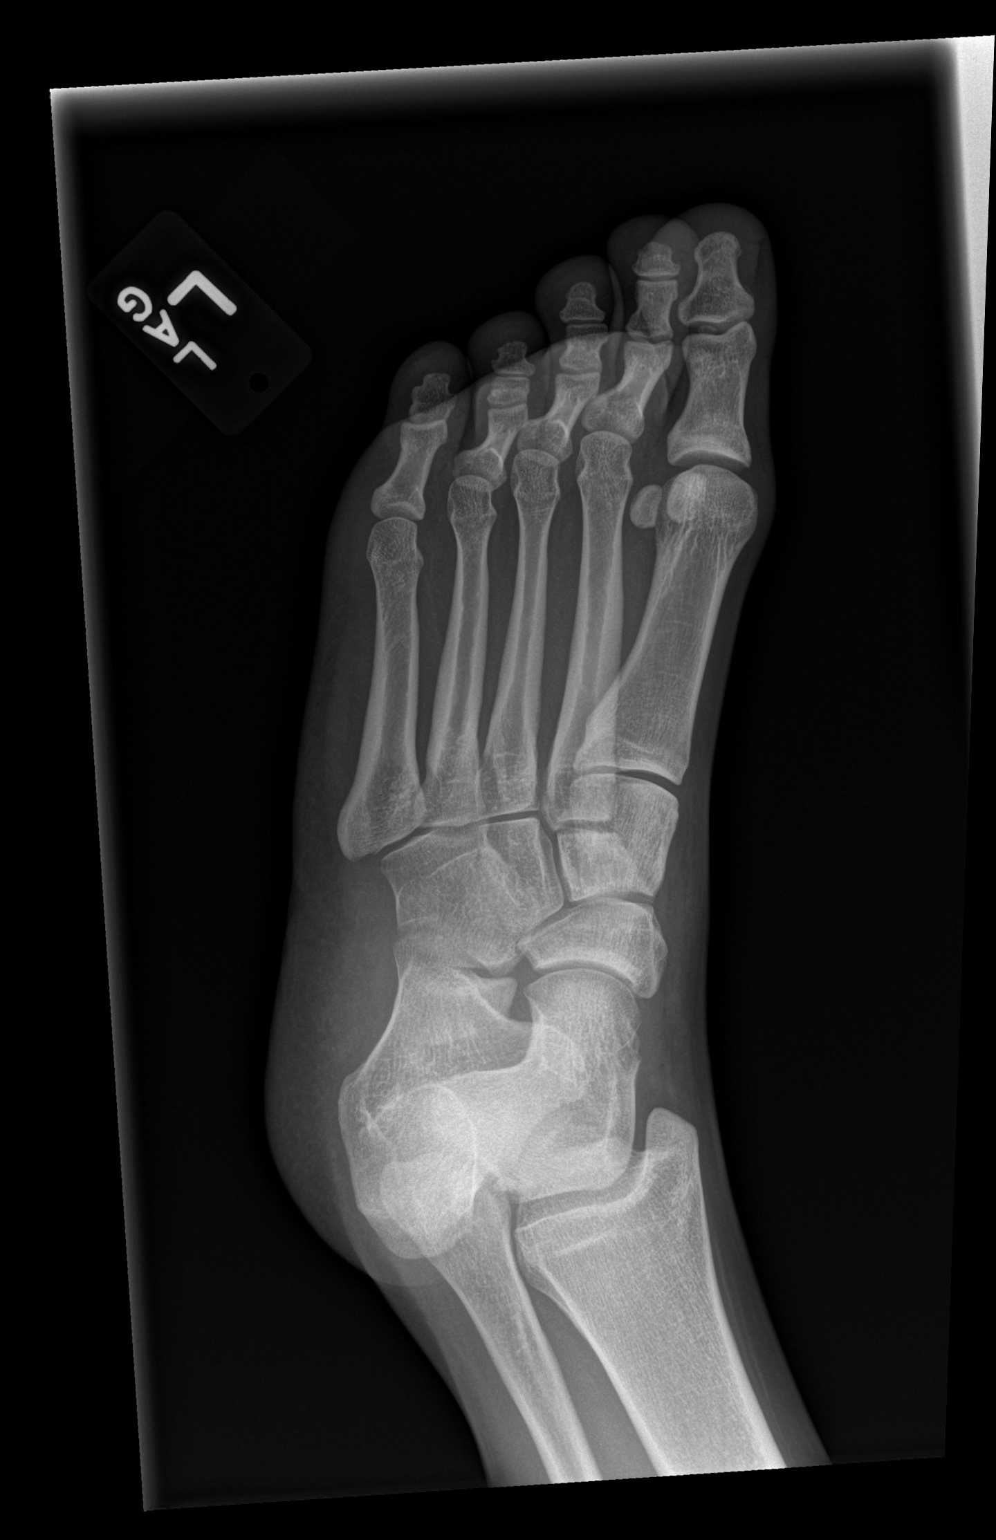

[x foot lat left]
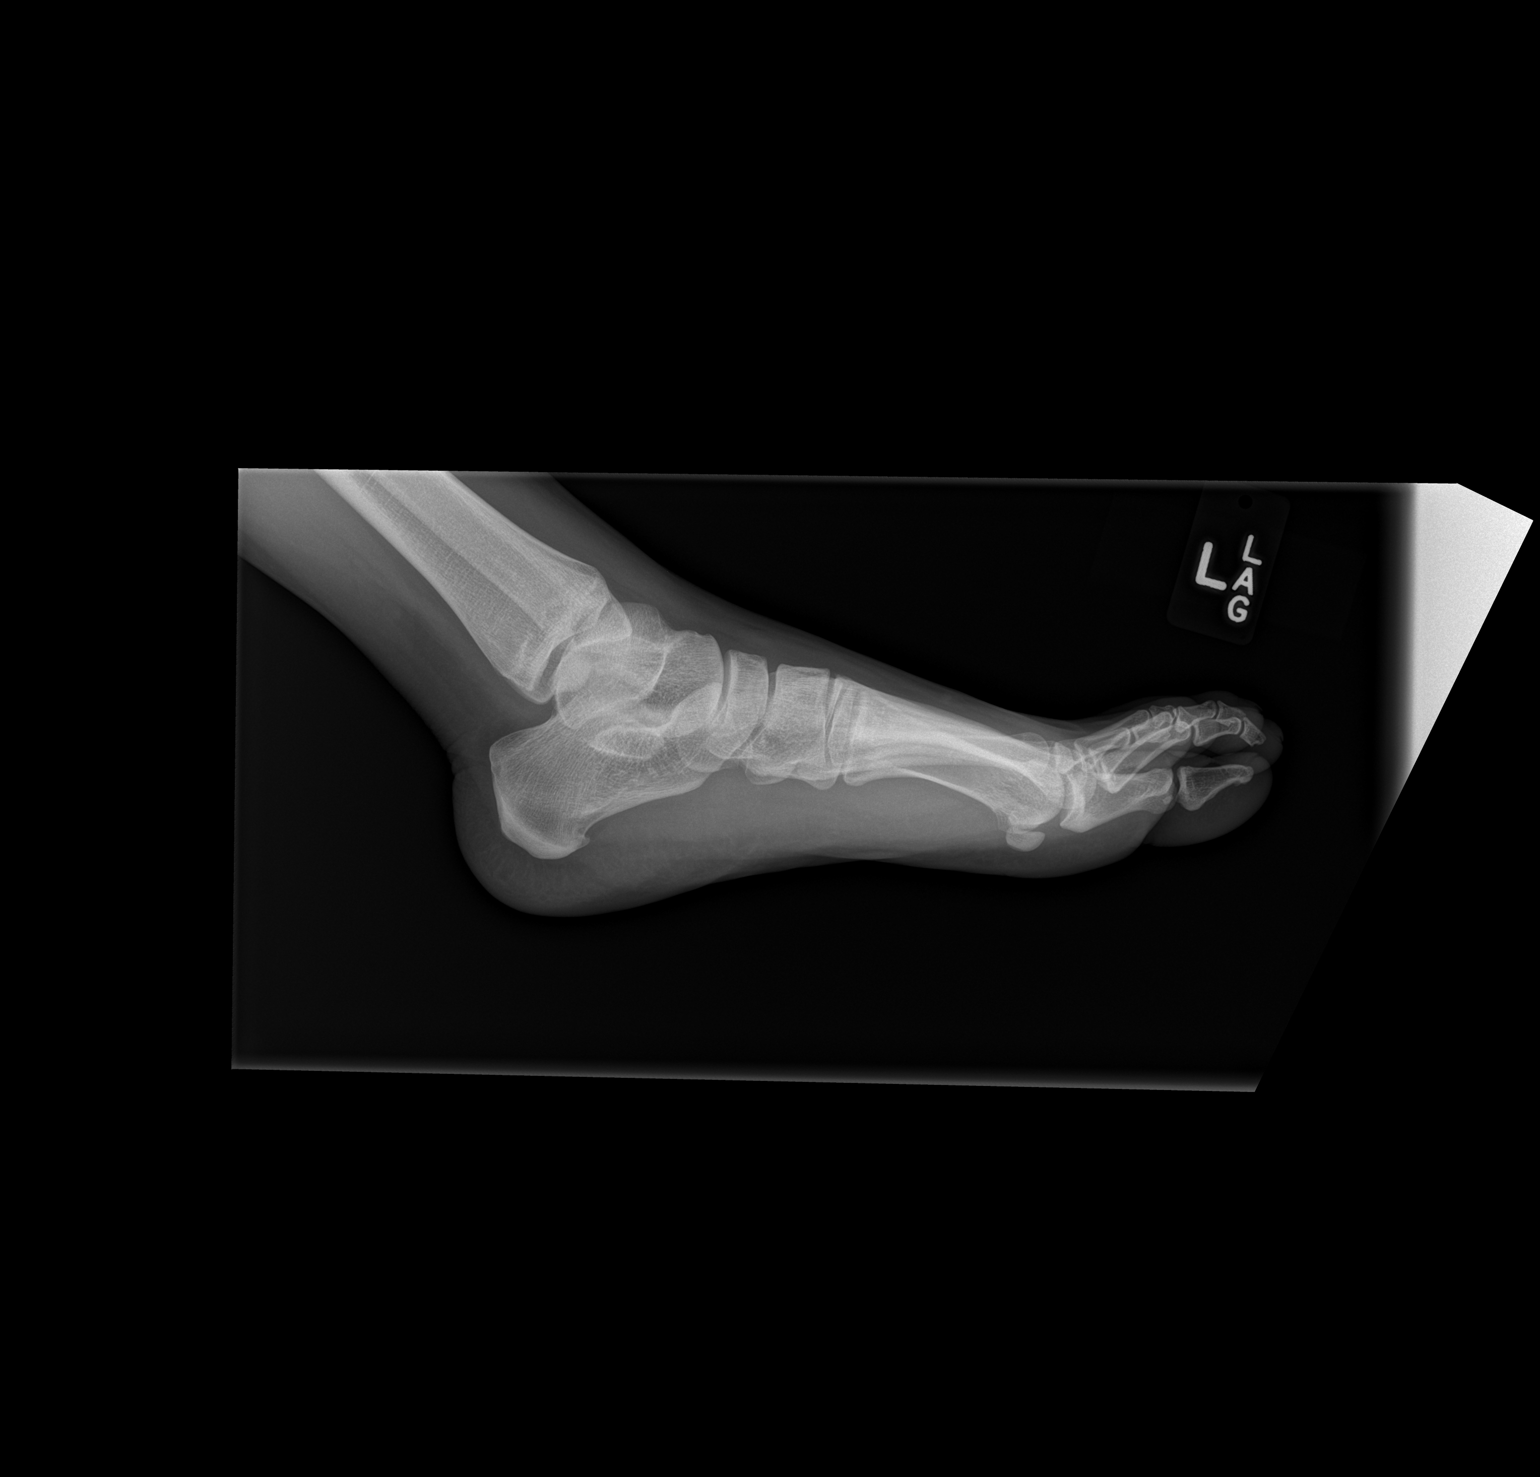

[3 of 3 positions shown; findings below may reference images not displayed]

FINDINGS: There is no evidence of fracture or dislocation. There is no
evidence of arthropathy or other focal bone abnormality. Soft
tissues are unremarkable.
IMPRESSION: Negative.
